# Patient Record
Sex: Female | Born: 1985 | Race: Black or African American | Hispanic: No | Marital: Single | Smoking: Current every day smoker
Health system: Southern US, Community
[De-identification: ages and names within clinical notes are randomized; demographics above are authoritative.]

## PROBLEM LIST (undated history)

## (undated) DIAGNOSIS — I10 Essential (primary) hypertension: Secondary | ICD-10-CM

## (undated) DIAGNOSIS — O039 Complete or unspecified spontaneous abortion without complication: Secondary | ICD-10-CM

## (undated) HISTORY — PX: TUBAL LIGATION: SHX77

---

## 2009-12-15 ENCOUNTER — Emergency Department (HOSPITAL_BASED_OUTPATIENT_CLINIC_OR_DEPARTMENT_OTHER): Admission: EM | Admit: 2009-12-15 | Discharge: 2009-12-15 | Payer: Self-pay | Admitting: Emergency Medicine

## 2010-12-30 LAB — URINALYSIS, ROUTINE W REFLEX MICROSCOPIC
Glucose, UA: NEGATIVE mg/dL
Ketones, ur: 15 mg/dL — AB
Protein, ur: 100 mg/dL — AB
pH: 6 (ref 5.0–8.0)

## 2010-12-30 LAB — URINE MICROSCOPIC-ADD ON

## 2010-12-30 LAB — PREGNANCY, URINE: Preg Test, Ur: NEGATIVE

## 2013-04-05 HISTORY — PX: DILATION AND CURETTAGE OF UTERUS: SHX78

## 2014-03-14 ENCOUNTER — Emergency Department (HOSPITAL_BASED_OUTPATIENT_CLINIC_OR_DEPARTMENT_OTHER)
Admission: EM | Admit: 2014-03-14 | Discharge: 2014-03-14 | Disposition: A | Discharge status: Home / Self Care | Payer: BC Managed Care – PPO | Attending: Emergency Medicine | Admitting: Emergency Medicine

## 2014-03-14 ENCOUNTER — Encounter (HOSPITAL_BASED_OUTPATIENT_CLINIC_OR_DEPARTMENT_OTHER): Payer: Self-pay | Admitting: Emergency Medicine

## 2014-03-14 DIAGNOSIS — A499 Bacterial infection, unspecified: Secondary | ICD-10-CM | POA: Insufficient documentation

## 2014-03-14 DIAGNOSIS — B9689 Other specified bacterial agents as the cause of diseases classified elsewhere: Secondary | ICD-10-CM | POA: Insufficient documentation

## 2014-03-14 DIAGNOSIS — F17200 Nicotine dependence, unspecified, uncomplicated: Secondary | ICD-10-CM | POA: Insufficient documentation

## 2014-03-14 DIAGNOSIS — N760 Acute vaginitis: Secondary | ICD-10-CM | POA: Insufficient documentation

## 2014-03-14 DIAGNOSIS — Z3202 Encounter for pregnancy test, result negative: Secondary | ICD-10-CM | POA: Insufficient documentation

## 2014-03-14 HISTORY — DX: Complete or unspecified spontaneous abortion without complication: O03.9

## 2014-03-14 LAB — WET PREP, GENITAL
TRICH WET PREP: NONE SEEN
YEAST WET PREP: NONE SEEN

## 2014-03-14 LAB — URINALYSIS, ROUTINE W REFLEX MICROSCOPIC
BILIRUBIN URINE: NEGATIVE
GLUCOSE, UA: NEGATIVE mg/dL
HGB URINE DIPSTICK: NEGATIVE
Ketones, ur: NEGATIVE mg/dL
Leukocytes, UA: NEGATIVE
Nitrite: NEGATIVE
PH: 5.5 (ref 5.0–8.0)
Protein, ur: NEGATIVE mg/dL
SPECIFIC GRAVITY, URINE: 1.013 (ref 1.005–1.030)
Urobilinogen, UA: 0.2 mg/dL (ref 0.0–1.0)

## 2014-03-14 LAB — PREGNANCY, URINE: PREG TEST UR: NEGATIVE

## 2014-03-14 MED ORDER — METRONIDAZOLE 500 MG PO TABS: 500.0000 mg | ORAL_TABLET | Freq: Two times a day (BID) | ORAL | Status: DC

## 2014-03-14 NOTE — ED Notes (Signed)
Patient states she developed a sudden lower abdominal pain this morning at 0900.  States the pain as intermittent sharp pain, associated with nausea.

## 2014-03-14 NOTE — ED Provider Notes (Signed)
CSN: [id number]     Arrival date & time 03/14/14  1252 History   First MD Initiated Contact with Patient 03/14/14 1310     Chief Complaint  Patient presents with  . Abdominal Pain     (Consider location/radiation/quality/duration/timing/severity/associated sxs/prior Treatment) HPI Comments: Pt states that she had acute onset of sharp lower abdominal pain that started this morning. Has had some nausea associated with it. Denies fever, vomiting or diarrhea. Pain has almost resolved. Was seen at [facility] last week because of urinary frequency and urgency and was told that she didn't have a uti. Denies vaginal discharge. Still having some urinary symptoms, states that she is having some problems with constipation  The history is provided by the patient. No language interpreter was used.    Past Medical History  Diagnosis Date  . Miscarriage 7/15   Past Surgical History  Procedure Laterality Date  . Dilation and curettage of uterus  7/14   No family history on file. History  Substance Use Topics  . Smoking status: Not on file  . Smokeless tobacco: Not on file  . Alcohol Use: Not on file   OB History   Grav Para Term Preterm Abortions TAB SAB Ect Mult Living                 Review of Systems  Constitutional: Negative.   Respiratory: Negative.   Cardiovascular: Negative.       Allergies  Review of patient's allergies indicates not on file.  Home Medications   Prior to Admission medications   Not on File   BP 115/81  Pulse 88  Temp(Src) 98.3 F (36.8 C) (Oral)  Resp 20  SpO2 99%  LMP 03/06/2014 Physical Exam  Nursing note and vitals reviewed. Constitutional: She is oriented to person, place, and time. She appears well-developed and well-nourished.  Cardiovascular: Normal rate and regular rhythm.   Pulmonary/Chest: Effort normal and breath sounds normal.  Abdominal: Soft. Bowel sounds are normal. There is no tenderness.  Genitourinary:  White discharge. No  cmt. No adnexal tenderness  Musculoskeletal: Normal range of motion.  Neurological: She is alert and oriented to person, place, and time.  Skin: Skin is warm and dry.  Psychiatric: She has a normal mood and affect.    ED Course  Procedures (including critical care time) Labs Review Labs Reviewed  WET PREP, GENITAL - Abnormal; Notable for the following:    Clue Cells Wet Prep HPF POC FEW (*)    WBC, Wet Prep HPF POC FEW (*)    All other components within normal limits  GC/CHLAMYDIA PROBE AMP  URINALYSIS, ROUTINE W REFLEX MICROSCOPIC  PREGNANCY, URINE    Imaging Review No results found.   EKG Interpretation None      MDM   Final diagnoses:  BV (bacterial vaginosis)    Abdomen in benign. Pt is pain free at this time. Will treat for bv. Std cultures sent    Teressa Lower, NP 03/14/14 1411

## 2014-03-14 NOTE — ED Provider Notes (Signed)
Medical screening examination/treatment/procedure(s) were performed by non-physician practitioner and as supervising physician I was immediately available for consultation/collaboration.   EKG Interpretation None        Rolan Bucco, MD 03/14/14 1504

## 2014-03-14 NOTE — Discharge Instructions (Signed)
Bacterial Vaginosis Bacterial vaginosis is an infection of the vagina. It happens when too many of certain germs (bacteria) grow in the vagina. HOME CARE  Take your medicine as told by your doctor.  Finish your medicine even if you start to feel better.  Do not have sex until you finish your medicine and are better.  Tell your sex partner that you have an infection. They should see their doctor for treatment.  Practice safe sex. Use condoms. Have only one sex partner. GET HELP IF:  You are not getting better after 3 days of treatment.  You have more grey fluid (discharge) coming from your vagina than before.  You have more pain than before.  You have a fever. MAKE SURE YOU:   Understand these instructions.  Will watch your condition.  Will get help right away if you are not doing well or get worse. Document Released: 07/01/2008 Document Revised: 07/13/2013 Document Reviewed: 05/04/2013 [employer] Patient Information 2014 [employer].  

## 2014-03-15 LAB — GC/CHLAMYDIA PROBE AMP
CT PROBE, AMP APTIMA: NEGATIVE
GC Probe RNA: NEGATIVE

## 2014-04-05 DIAGNOSIS — O039 Complete or unspecified spontaneous abortion without complication: Secondary | ICD-10-CM

## 2014-04-05 HISTORY — DX: Complete or unspecified spontaneous abortion without complication: O03.9

## 2015-05-30 ENCOUNTER — Emergency Department (HOSPITAL_BASED_OUTPATIENT_CLINIC_OR_DEPARTMENT_OTHER): Payer: BLUE CROSS/BLUE SHIELD

## 2015-05-30 ENCOUNTER — Encounter (HOSPITAL_BASED_OUTPATIENT_CLINIC_OR_DEPARTMENT_OTHER): Payer: Self-pay | Admitting: Emergency Medicine

## 2015-05-30 ENCOUNTER — Emergency Department (HOSPITAL_BASED_OUTPATIENT_CLINIC_OR_DEPARTMENT_OTHER)
Admission: EM | Admit: 2015-05-30 | Discharge: 2015-05-30 | Disposition: A | Discharge status: Home / Self Care | Payer: BLUE CROSS/BLUE SHIELD | Attending: Emergency Medicine | Admitting: Emergency Medicine

## 2015-05-30 DIAGNOSIS — Z880 Allergy status to penicillin: Secondary | ICD-10-CM | POA: Diagnosis not present

## 2015-05-30 DIAGNOSIS — Z720 Tobacco use: Secondary | ICD-10-CM | POA: Diagnosis not present

## 2015-05-30 DIAGNOSIS — Y998 Other external cause status: Secondary | ICD-10-CM | POA: Insufficient documentation

## 2015-05-30 DIAGNOSIS — Y9389 Activity, other specified: Secondary | ICD-10-CM | POA: Diagnosis not present

## 2015-05-30 DIAGNOSIS — M25561 Pain in right knee: Secondary | ICD-10-CM

## 2015-05-30 DIAGNOSIS — Y92410 Unspecified street and highway as the place of occurrence of the external cause: Secondary | ICD-10-CM | POA: Diagnosis not present

## 2015-05-30 DIAGNOSIS — S8991XA Unspecified injury of right lower leg, initial encounter: Secondary | ICD-10-CM | POA: Insufficient documentation

## 2015-05-30 MED ORDER — IBUPROFEN 600 MG PO TABS: 600.0000 mg | ORAL_TABLET | Freq: Four times a day (QID) | ORAL | Status: DC | PRN

## 2015-05-30 MED ORDER — IBUPROFEN 800 MG PO TABS
800.0000 mg | ORAL_TABLET | Freq: Once | ORAL | Status: AC
  Administered 2015-05-30: 800 mg via ORAL
  Filled 2015-05-30: qty 1

## 2015-05-30 NOTE — Discharge Instructions (Signed)
You were evaluated in the ED for your right knee pain. Her x-rays are negative for any acute token bones or dislocations. You were given a knee immobilizer, he may wear this while walking, but take it off when at rest. He may take Motrin as needed for discomfort. Please follow-up with your doctors in one week for reevaluation. Return to ED for worsening symptoms like fevers, chills, redness or swelling in her knee, decreased range of motion.  Arthralgia Your caregiver has diagnosed you as suffering from an arthralgia. Arthralgia means there is pain in a joint. This can come from many reasons including:  Bruising the joint which causes soreness (inflammation) in the joint.  Wear and tear on the joints which occur as we grow older (osteoarthritis).  Overusing the joint.  Various forms of arthritis.  Infections of the joint. Regardless of the cause of pain in your joint, most of these different pains respond to anti-inflammatory drugs and rest. The exception to this is when a joint is infected, and these cases are treated with antibiotics, if it is a bacterial infection. HOME CARE INSTRUCTIONS   Rest the injured area for as long as directed by your caregiver. Then slowly start using the joint as directed by your caregiver and as the pain allows. Crutches as directed may be useful if the ankles, knees or hips are involved. If the knee was splinted or casted, continue use and care as directed. If an stretchy or elastic wrapping bandage has been applied today, it should be removed and re-applied every 3 to 4 hours. It should not be applied tightly, but firmly enough to keep swelling down. Watch toes and feet for swelling, bluish discoloration, coldness, numbness or excessive pain. If any of these problems (symptoms) occur, remove the ace bandage and re-apply more loosely. If these symptoms persist, contact your caregiver or return to this location.  For the first 24 hours, keep the injured extremity  elevated on pillows while lying down.  Apply ice for 15-20 minutes to the sore joint every couple hours while awake for the first half day. Then 03-04 times per day for the first 48 hours. Put the ice in a plastic bag and place a towel between the bag of ice and your skin.  Wear any splinting, casting, elastic bandage applications, or slings as instructed.  Only take over-the-counter or prescription medicines for pain, discomfort, or fever as directed by your caregiver. Do not use aspirin immediately after the injury unless instructed by your physician. Aspirin can cause increased bleeding and bruising of the tissues.  If you were given crutches, continue to use them as instructed and do not resume weight bearing on the sore joint until instructed. Persistent pain and inability to use the sore joint as directed for more than 2 to 3 days are warning signs indicating that you should see a caregiver for a follow-up visit as soon as possible. Initially, a hairline fracture (break in bone) may not be evident on X-rays. Persistent pain and swelling indicate that further evaluation, non-weight bearing or use of the joint (use of crutches or slings as instructed), or further X-rays are indicated. X-rays may sometimes not show a small fracture until a week or 10 days later. Make a follow-up appointment with your own caregiver or one to whom we have referred you. A radiologist (specialist in reading X-rays) may read your X-rays. Make sure you know how you are to obtain your X-ray results. Do not assume everything is normal if  you do not hear from [place]. SEEK MEDICAL CARE IF: Bruising, swelling, or pain increases. SEEK IMMEDIATE MEDICAL CARE IF:   Your fingers or toes are numb or blue.  The pain is not responding to medications and continues to stay the same or get worse.  The pain in your joint becomes severe.  You develop a fever over 102 F (38.9 C).  It becomes impossible to move or use the joint. MAKE  SURE YOU:   Understand these instructions.  Will watch your condition.  Will get help right away if you are not doing well or get worse. Document Released: 09/22/2005 Document Revised: 12/15/2011 Document Reviewed: 05/10/2008 [facility] Patient Information 2015 [place], [place]. This information is not intended to replace advice given to you by your health care provider. Make sure you discuss any questions you have with your health care provider.

## 2015-05-30 NOTE — ED Notes (Signed)
PA at bedside.
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
## 2015-11-12 ENCOUNTER — Other Ambulatory Visit (HOSPITAL_COMMUNITY): Payer: Self-pay | Admitting: Specialist

## 2015-11-12 DIAGNOSIS — Z3A16 16 weeks gestation of pregnancy: Secondary | ICD-10-CM

## 2015-11-12 DIAGNOSIS — Q913 Trisomy 18, unspecified: Secondary | ICD-10-CM

## 2015-11-15 ENCOUNTER — Encounter (HOSPITAL_COMMUNITY): Payer: Self-pay | Admitting: Specialist

## 2015-11-15 ENCOUNTER — Other Ambulatory Visit: Payer: Self-pay | Admitting: Specialist

## 2015-11-22 ENCOUNTER — Encounter (HOSPITAL_COMMUNITY): Payer: Self-pay

## 2015-11-22 ENCOUNTER — Ambulatory Visit (HOSPITAL_COMMUNITY)
Admission: RE | Admit: 2015-11-22 | Discharge: 2015-11-22 | Disposition: A | Discharge status: Home / Self Care | Payer: BLUE CROSS/BLUE SHIELD | Source: Ambulatory Visit | Attending: Specialist | Admitting: Specialist

## 2015-11-22 ENCOUNTER — Other Ambulatory Visit (HOSPITAL_COMMUNITY): Payer: Self-pay | Admitting: Specialist

## 2015-11-22 VITALS — BP 116/69 | HR 83 | Wt 186.2 lb

## 2015-11-22 DIAGNOSIS — Z3689 Encounter for other specified antenatal screening: Secondary | ICD-10-CM

## 2015-11-22 DIAGNOSIS — O34219 Maternal care for unspecified type scar from previous cesarean delivery: Secondary | ICD-10-CM | POA: Insufficient documentation

## 2015-11-22 DIAGNOSIS — O280 Abnormal hematological finding on antenatal screening of mother: Secondary | ICD-10-CM

## 2015-11-22 DIAGNOSIS — Z315 Encounter for genetic counseling: Secondary | ICD-10-CM | POA: Insufficient documentation

## 2015-11-22 DIAGNOSIS — Z3A18 18 weeks gestation of pregnancy: Secondary | ICD-10-CM | POA: Insufficient documentation

## 2015-11-22 DIAGNOSIS — O99332 Smoking (tobacco) complicating pregnancy, second trimester: Secondary | ICD-10-CM

## 2015-11-22 DIAGNOSIS — O283 Abnormal ultrasonic finding on antenatal screening of mother: Secondary | ICD-10-CM | POA: Diagnosis not present

## 2015-11-22 DIAGNOSIS — Z3A16 16 weeks gestation of pregnancy: Secondary | ICD-10-CM

## 2015-11-22 DIAGNOSIS — Q913 Trisomy 18, unspecified: Secondary | ICD-10-CM

## 2015-11-22 NOTE — Progress Notes (Signed)
Genetic Counseling  High-Risk Gestation Note  Appointment Date:  11/22/2015 Referred By: Alm Bustard, MD Date of Birth:  [birth year]   Pregnancy History: [id number] Estimated Date of Delivery: 04/24/16 Estimated Gestational Age: [redacted]w[redacted]d Attending: Alpha Gula, MD   [name] was seen for genetic counseling because of an increased risk for fetal trisomy 18 based on Quad screen through [facility].  In Summary:   1 in 28 Trisomy 18 risk from Quad screen  Noninvasive prenatal screening ([facility]) subsequently performed through OB and within normal range  Detailed ultrasound performed today; complete ultrasound results reported separately  Echogenic intracardiac focus visualized; Given low risk NIPS, this finding would not significantly adjust the risk for fetal Down syndrome  Patient declined amniocentesis  Family history noncontributory  Follow-up ultrasound scheduled to assess fetal growth given low analyte on Quad screen  She was counseled regarding the Quad screen result and the associated 1 in 36 risk for fetal trisomy 18.  We reviewed chromosomes, nondisjunction, and the common features poor prognosis of trisomy 78.  In addition, we reviewed the screen adjusted reduction in risks for Down syndrome (1 in 16,800) and ONTDs.  We also discussed other explanations for a screen positive result including: a gestational dating error, differences in maternal metabolism, and normal variation. They understand that this screening is not diagnostic for Trisomy 18 but provides a risk assessment.  [name] subsequently had noninvasive prenatal screening (NIPS)/cell free DNA testing (cfDNA) facilitated through her OB office. This screen through [facility] was within normal range for chromosomes 21, 18, 13, and sex chromosomes. We reviewed the methodology of NIPS, the conditions for which it assesses and the detection rates and false positive rates. We discussed that while highly specific  and sensitive, it is not diagnostic for these conditions.   Detailed ultrasound was performed today. Visualized fetal anatomy was within normal limits. Echogenic intracardiac focus was visualized. Complete ultrasound results reported separately. An isolated echogenic focus is generally believed to be a normal variation without any concerns for the pregnancy.  Isolated echogenic cardiac foci are not associated with congenital heart defects in the baby or compromised cardiac function after birth.  However, an echogenic cardiac focus is associated with a slightly increased chance for Down syndrome in the pregnancy. Given the patient's previous Quad screen, which decreased the risk for fetal Down syndrome and the patient's previous low risk NIPS, the finding of EIF would not be associated with a significant increased risk for fetal Down syndrome.   She was also counseled regarding diagnostic testing via amniocentesis. We reviewed the approximate 1 in 300-500 risk for complications for amniocentesis, including spontaneous pregnancy loss. After careful consideration, she declined amniocentesis. A follow-up ultrasound was scheduled for 01/03/16, given the low uE3 analyte (0.4 MoM) on the patient's Quad screen. She understands that screening tests cannot rule out all birth defects or genetic syndromes. The patient was advised of this limitation and states she still does not want additional testing at this time.   [name] was provided with written information regarding sickle cell anemia (SCA) including the carrier frequency and incidence in the African-American population, the availability of carrier testing and prenatal diagnosis if indicated.  In addition, we discussed that hemoglobinopathies are routinely screened for as part of the [place] newborn screening panel.  She was unsure if this screening had been performed previously for her. She declined hemoglobin electrophoresis today.   Both family histories  were reviewed and found to be noncontributory for  birth defects, intellectual disability, recurrent pregnancy loss, and known genetic conditions. [name] was not familiar with the father of the baby's extended family history.  We, therefore, cannot comment on how his history might contribute to the overall chance for the baby to have a birth defect.  Without further information regarding the provided family history, an accurate genetic risk cannot be calculated. Further genetic counseling is warranted if more information is obtained.  [name] denied exposure to environmental toxins or chemical agents. She denied the use of alcohol, tobacco or street drugs. She denied significant viral illnesses during the course of her pregnancy. Her medical and surgical histories were noncontributory.   I counseled [name] for approximately 30 minutes regarding the above risks and available options.   Quinn Plowman, MS,  Certified Genetic Counselor 11/22/2015

## 2015-11-28 ENCOUNTER — Other Ambulatory Visit (HOSPITAL_COMMUNITY): Payer: Self-pay

## 2016-01-03 ENCOUNTER — Encounter (HOSPITAL_COMMUNITY): Payer: Self-pay

## 2016-01-03 ENCOUNTER — Ambulatory Visit (HOSPITAL_COMMUNITY)
Admission: RE | Admit: 2016-01-03 | Discharge: 2016-01-03 | Disposition: A | Discharge status: Home / Self Care | Payer: BLUE CROSS/BLUE SHIELD | Source: Ambulatory Visit | Attending: Specialist | Admitting: Specialist

## 2016-01-03 ENCOUNTER — Other Ambulatory Visit (HOSPITAL_COMMUNITY): Payer: Self-pay | Admitting: Maternal and Fetal Medicine

## 2016-01-03 DIAGNOSIS — O283 Abnormal ultrasonic finding on antenatal screening of mother: Secondary | ICD-10-CM | POA: Diagnosis not present

## 2016-01-03 DIAGNOSIS — O358XX0 Maternal care for other (suspected) fetal abnormality and damage, not applicable or unspecified: Secondary | ICD-10-CM | POA: Insufficient documentation

## 2016-01-03 DIAGNOSIS — O34219 Maternal care for unspecified type scar from previous cesarean delivery: Secondary | ICD-10-CM

## 2016-01-03 DIAGNOSIS — Z3A24 24 weeks gestation of pregnancy: Secondary | ICD-10-CM | POA: Diagnosis not present

## 2016-01-03 DIAGNOSIS — O99332 Smoking (tobacco) complicating pregnancy, second trimester: Secondary | ICD-10-CM | POA: Diagnosis not present

## 2016-01-03 DIAGNOSIS — IMO0001 Reserved for inherently not codable concepts without codable children: Secondary | ICD-10-CM

## 2016-01-03 DIAGNOSIS — O289 Unspecified abnormal findings on antenatal screening of mother: Secondary | ICD-10-CM

## 2016-01-03 DIAGNOSIS — O280 Abnormal hematological finding on antenatal screening of mother: Secondary | ICD-10-CM

## 2016-01-04 ENCOUNTER — Other Ambulatory Visit (HOSPITAL_COMMUNITY): Payer: Self-pay | Admitting: *Deleted

## 2016-01-04 DIAGNOSIS — O281 Abnormal biochemical finding on antenatal screening of mother: Secondary | ICD-10-CM

## 2016-02-14 ENCOUNTER — Encounter (HOSPITAL_COMMUNITY): Payer: Self-pay

## 2016-02-14 ENCOUNTER — Ambulatory Visit (HOSPITAL_COMMUNITY)
Admission: RE | Admit: 2016-02-14 | Discharge: 2016-02-14 | Disposition: A | Discharge status: Home / Self Care | Payer: BLUE CROSS/BLUE SHIELD | Source: Ambulatory Visit | Attending: Specialist | Admitting: Specialist

## 2016-02-14 ENCOUNTER — Other Ambulatory Visit (HOSPITAL_COMMUNITY): Payer: Self-pay | Admitting: Maternal and Fetal Medicine

## 2016-02-14 VITALS — BP 114/74 | HR 75 | Wt 203.5 lb

## 2016-02-14 DIAGNOSIS — Z3A30 30 weeks gestation of pregnancy: Secondary | ICD-10-CM

## 2016-02-14 DIAGNOSIS — O99333 Smoking (tobacco) complicating pregnancy, third trimester: Secondary | ICD-10-CM

## 2016-02-14 DIAGNOSIS — O281 Abnormal biochemical finding on antenatal screening of mother: Secondary | ICD-10-CM | POA: Insufficient documentation

## 2016-02-14 DIAGNOSIS — O280 Abnormal hematological finding on antenatal screening of mother: Secondary | ICD-10-CM

## 2016-02-14 DIAGNOSIS — O289 Unspecified abnormal findings on antenatal screening of mother: Secondary | ICD-10-CM

## 2016-07-31 IMAGING — US US MFM OB FOLLOW-UP
1 series · 14 of 28 positions shown · non-contrast
Comparison: none

[Series 1: us mfm ob follow-up · 38 acquisitions, 14 frames shown]
[im 2/38]
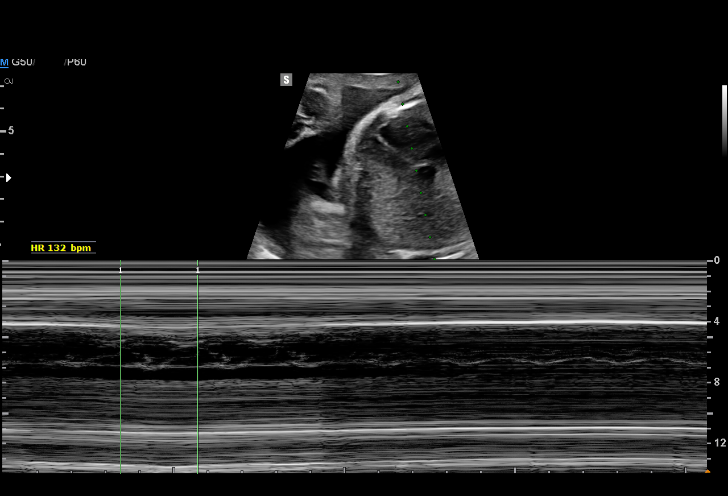
[im 5/38]
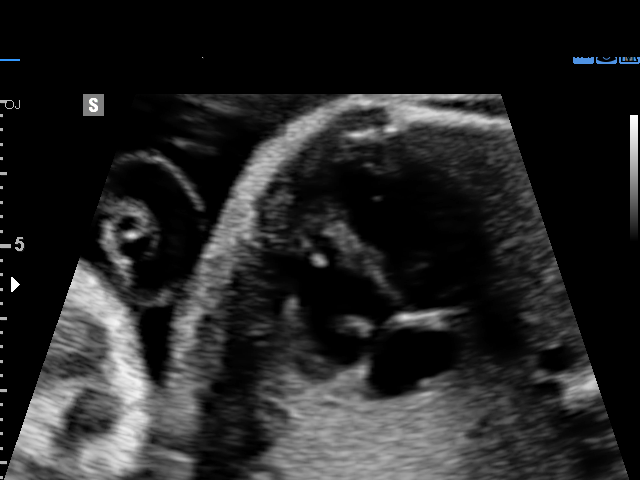
[im 7/38]
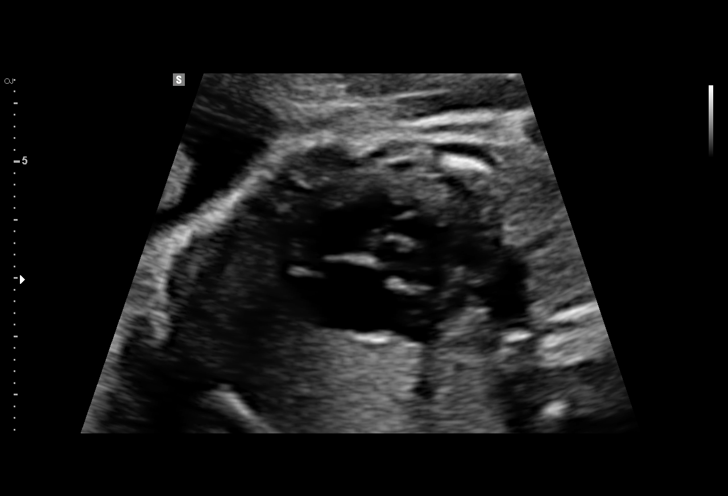
[im 10/38]
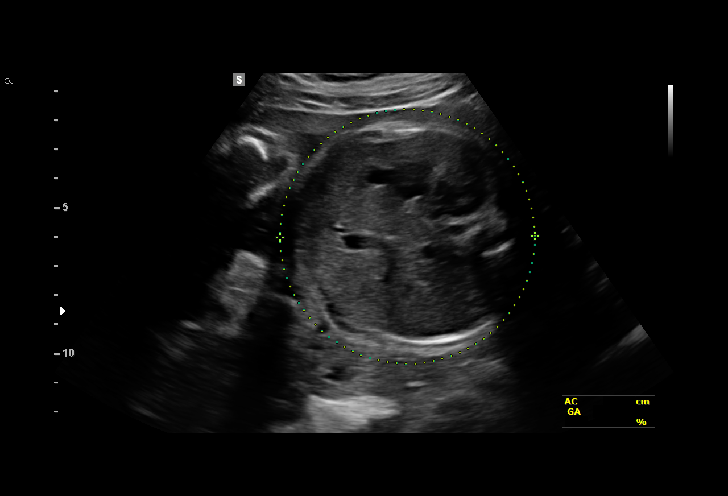
[im 13/38]
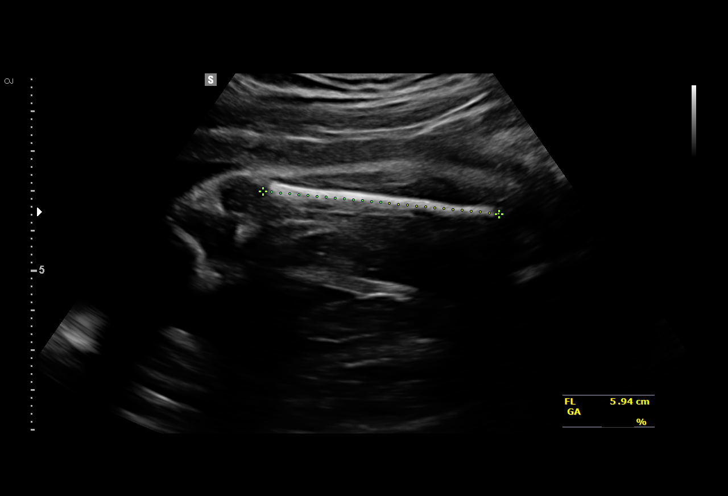
[im 16/38]
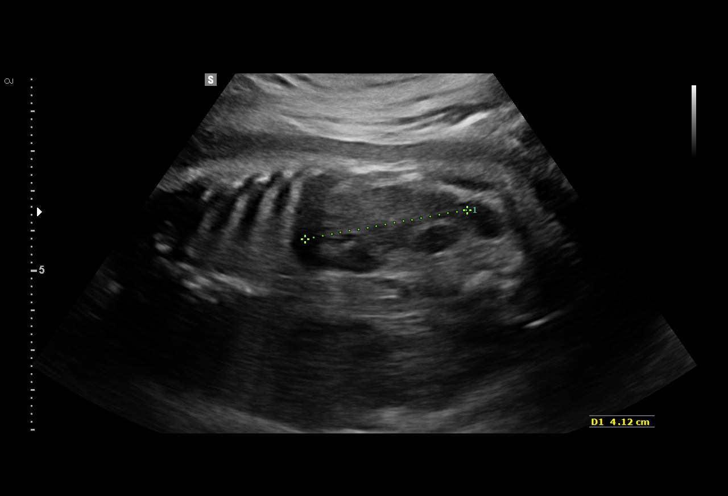
[im 18/38]
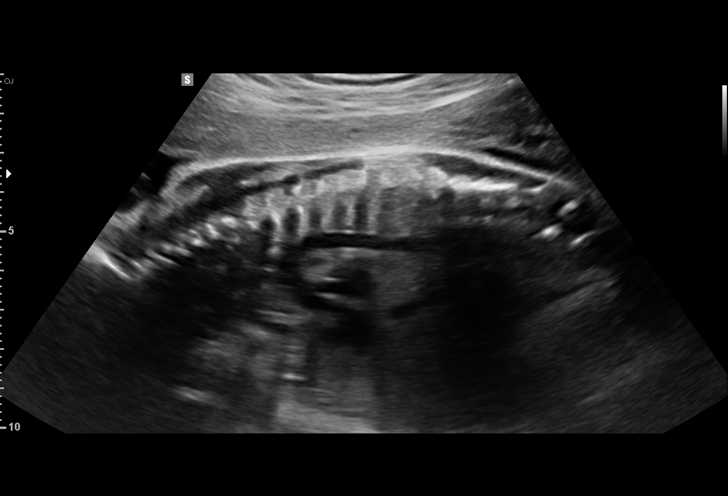
[im 21/38]
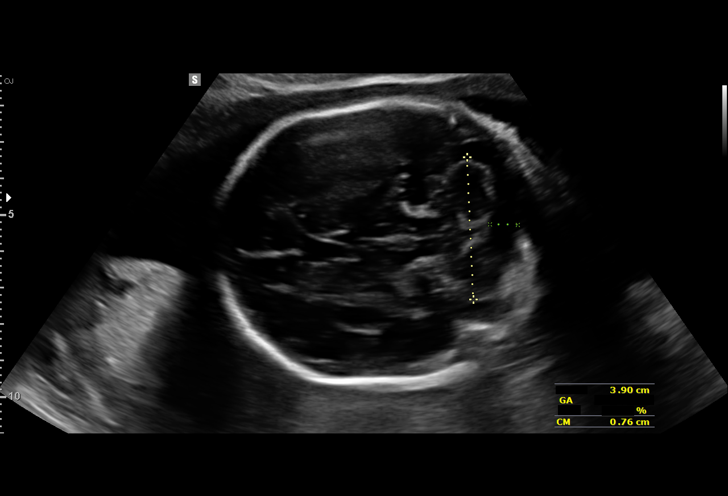
[im 24/38]
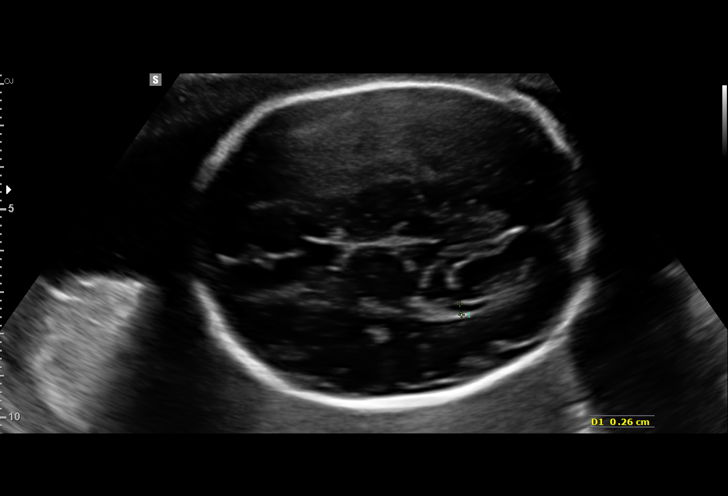
[im 27/38]
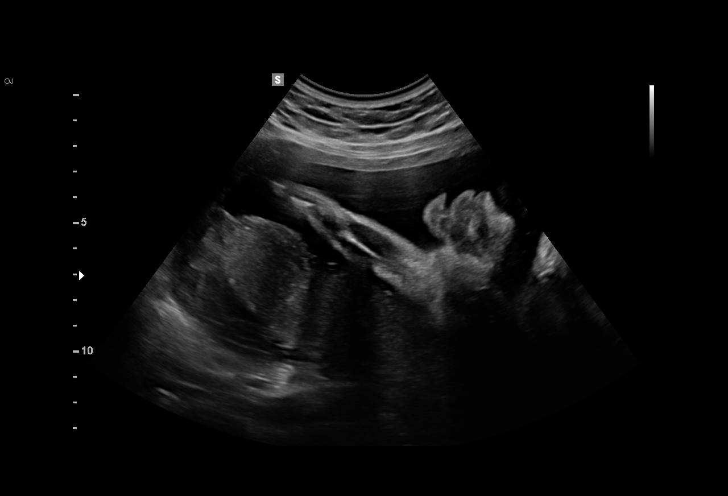
[im 29/38]
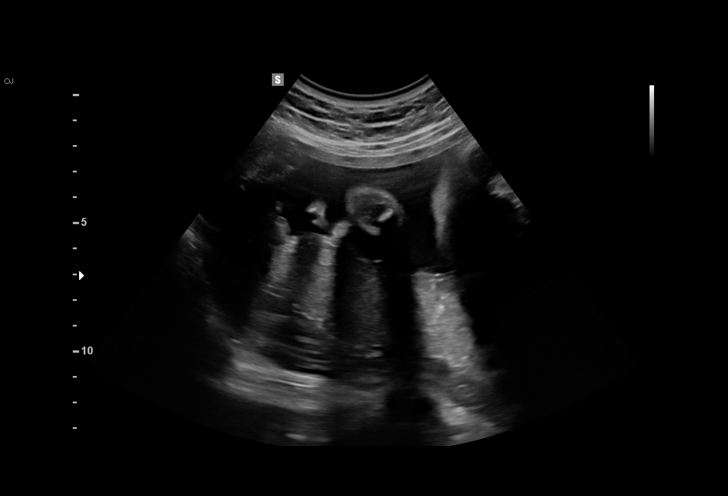
[im 32/38]
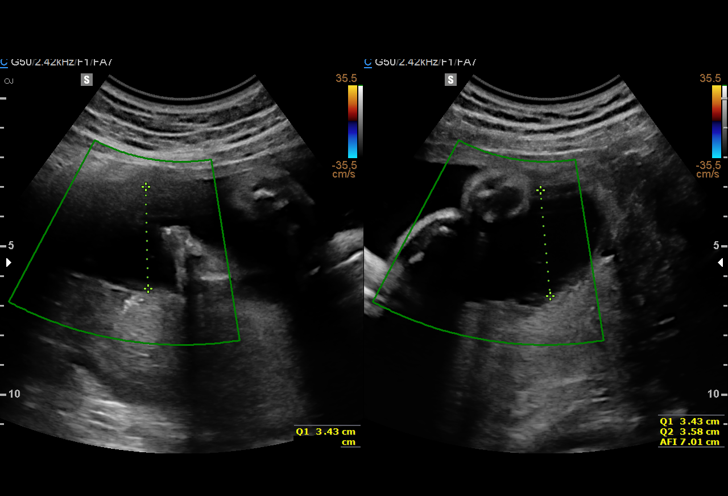
[im 35/38]
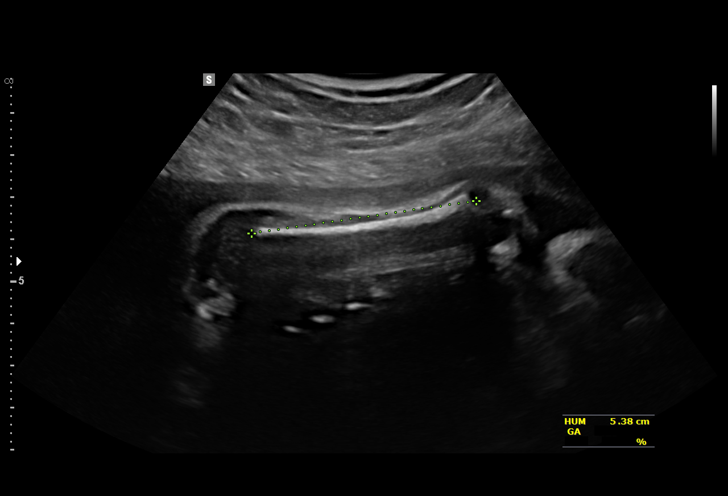
[im 38/38]
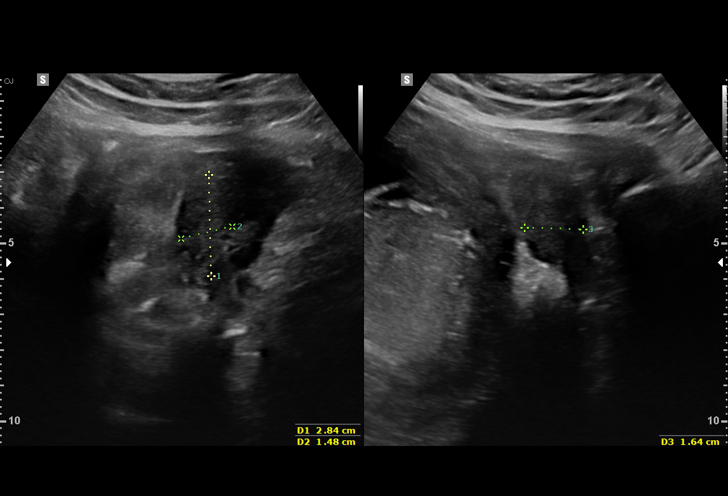

[14 of 28 positions shown; findings below may reference images not displayed]

[HOSPITAL][HOSPITAL]

1  LORRAINE JIM            [id number]       [id number]     [id number]
Indications

30 weeks gestation of pregnancy
Abnormal biochemical screen (quad) for
Trisomy 18 ([DATE]) - low risk NIPS
Abnormal finding on antenatal screening
(low uE3 0.4 MoM)
Tobacco use complicating pregnancy, third
trimester
OB History

Gravidity:    3         Term:   1        Prem:   0         SAB:   0
TOP:          1       Ectopic:  0        Living: 1
Fetal Evaluation

Num Of Fetuses:     1
Fetal Heart         132
Rate(bpm):
Cardiac Activity:   Observed
Presentation:       Breech
Placenta:           Posterior, above cervical os

Amniotic Fluid
AFI FV:      Subjectively within normal limits

AFI Sum(cm)     %Tile       Largest Pocket(cm)
13.28           40

RUQ(cm)       RLQ(cm)       LUQ(cm)        LLQ(cm)
3.43
Biometry
BPD:      76.8  mm     G. Age:  30w 6d         64  %    CI:         73.38  %    70 - 86
FL/HC:       20.9  %    19.2 -
HC:      284.9  mm     G. Age:  31w 2d         52  %    HC/AC:       1.04       0.99 -
AC:      275.1  mm     G. Age:  31w 4d         86  %    FL/BPD:      77.6  %    71 - 87
FL:       59.6  mm     G. Age:  31w 0d         65  %    FL/AC:       21.7  %    20 - 24
HUM:      53.9  mm     G. Age:  31w 2d         76  %
CER:        39  mm     G. Age:  33w 0d         95  %

CM:        7.6  mm

Est. FW:    0366   gm    3 lb 13 oz     76  %
Gestational Age

LMP:           30w 0d        Date:  07/19/15                 EDD:    04/24/16
U/S Today:     31w 1d                                        EDD:    04/16/16
Best:          30w 0d     Det. By:  LMP  (07/19/15)          EDD:    04/24/16
Anatomy

Cranium:               Appears normal         Aortic Arch:            Appears normal
Cavum:                 Appears normal         Ductal Arch:            Previously seen
Ventricles:            Appears normal         Diaphragm:              Appears normal
Choroid Plexus:        Previously seen        Stomach:                Appears normal, left
sided
Cerebellum:            Appears normal         Abdomen:                Appears normal
Posterior Fossa:       Appears normal         Abdominal Wall:         Previously seen
Nuchal Fold:           Previously seen        Cord Vessels:           Appears normal (3
vessel cord)
Face:                  Orbits and profile     Kidneys:                Appear normal
previously seen
Lips:                  Appears normal         Bladder:                Appears normal
Thoracic:              Appears normal         Spine:                  Previously seen
Heart:                 Appears normal         Upper Extremities:      Previously seen
(4CH, axis, and
situs)
RVOT:                  Previously seen        Lower Extremities:      Previously seen
LVOT:                  Previously seen

Other:  Fetus appears to be a male. Heels and 5th digit previously visualized.
Nasal bone previously visualized. Technically difficult due to fetal
position.
Cervix Uterus Adnexa

Cervix
Not visualized (advanced GA >22wks)

Left Ovary
Within normal limits.

Right Ovary
Within normal limits.
Impression

SIUP at 30+0 weeks
Normal interval anatomy; anatomic survey complete
Normal amniotic fluid volume
Appropriate interval growth with EFW at the 76th %tile
Recommendations

Follow-up as clinically indicated

## 2016-09-26 ENCOUNTER — Encounter (HOSPITAL_COMMUNITY): Payer: Self-pay

## 2016-10-06 ENCOUNTER — Emergency Department (HOSPITAL_BASED_OUTPATIENT_CLINIC_OR_DEPARTMENT_OTHER)
Admission: EM | Admit: 2016-10-06 | Discharge: 2016-10-06 | Disposition: A | Discharge status: Home / Self Care | Payer: BLUE CROSS/BLUE SHIELD | Attending: Emergency Medicine | Admitting: Emergency Medicine

## 2016-10-06 ENCOUNTER — Encounter (HOSPITAL_BASED_OUTPATIENT_CLINIC_OR_DEPARTMENT_OTHER): Payer: Self-pay | Admitting: *Deleted

## 2016-10-06 DIAGNOSIS — T783XXA Angioneurotic edema, initial encounter: Secondary | ICD-10-CM | POA: Insufficient documentation

## 2016-10-06 DIAGNOSIS — F17210 Nicotine dependence, cigarettes, uncomplicated: Secondary | ICD-10-CM | POA: Diagnosis not present

## 2016-10-06 DIAGNOSIS — T464X5A Adverse effect of angiotensin-converting-enzyme inhibitors, initial encounter: Secondary | ICD-10-CM

## 2016-10-06 DIAGNOSIS — I10 Essential (primary) hypertension: Secondary | ICD-10-CM | POA: Insufficient documentation

## 2016-10-06 DIAGNOSIS — R220 Localized swelling, mass and lump, head: Secondary | ICD-10-CM | POA: Diagnosis present

## 2016-10-06 HISTORY — DX: Essential (primary) hypertension: I10

## 2016-10-06 MED ORDER — PREDNISONE 10 MG PO TABS: 20.0000 mg | ORAL_TABLET | Freq: Every day | ORAL | 0 refills | Status: AC

## 2016-10-06 MED ORDER — FAMOTIDINE IN NACL 20-0.9 MG/50ML-% IV SOLN
20.0000 mg | Freq: Once | INTRAVENOUS | Status: AC
  Administered 2016-10-06: 20 mg via INTRAVENOUS
  Filled 2016-10-06: qty 50

## 2016-10-06 MED ORDER — FAMOTIDINE 20 MG PO TABS: 20.0000 mg | ORAL_TABLET | Freq: Two times a day (BID) | ORAL | 0 refills | Status: AC

## 2016-10-06 MED ORDER — DIPHENHYDRAMINE HCL 50 MG/ML IJ SOLN
50.0000 mg | Freq: Once | INTRAMUSCULAR | Status: AC
  Administered 2016-10-06: 50 mg via INTRAVENOUS
  Filled 2016-10-06: qty 1

## 2016-10-06 MED ORDER — DIPHENHYDRAMINE HCL 25 MG PO TABS: 25.0000 mg | ORAL_TABLET | Freq: Four times a day (QID) | ORAL | 0 refills | Status: AC | PRN

## 2016-10-06 MED ORDER — METHYLPREDNISOLONE SODIUM SUCC 125 MG IJ SOLR
125.0000 mg | Freq: Once | INTRAMUSCULAR | Status: AC
  Administered 2016-10-06: 125 mg via INTRAVENOUS
  Filled 2016-10-06: qty 2

## 2016-10-06 NOTE — ED Provider Notes (Signed)
[facility] Provider [facility]   CSN: [id number] Arrival date & time: 10/06/16  1427     History   Chief Complaint Chief Complaint  Patient presents with  . Oral Swelling    HPI Tina Rogers is a 31 y.o. female.  HPI 31 yo F with PMHx of HTN on lisinopril who p/w lower lip swelling. Pt was in usual state of health until this morning, when she awoke with lower lip swelling. She noticed a mild swollen and itching sensation in her lip and went to the mirror, where she noticed it was swollen. She denies any other swelling. Denies any tongue or upper lip swelling. She denies any voice changes or difficulty swallowing. Denies any shortness of breath. She has been on lisinopril for 5 months. No other recent medication or makeup changes. No other known allergy exposures. No rash.  Past Medical History:  Diagnosis Date  . Hypertension   . Miscarriage 7/15    Patient Active Problem List   Diagnosis Date Noted  . Abnormal maternal serum screening test 11/22/2015  . [redacted] weeks gestation of pregnancy     Past Surgical History:  Procedure Laterality Date  . DILATION AND CURETTAGE OF UTERUS  7/14  . TUBAL LIGATION      OB History    Gravida Para Term Preterm AB Living   3 1 1  0 1 1   SAB TAB Ectopic Multiple Live Births   0 1 0 0         Home Medications    Prior to Admission medications   Not on File    Family History No family history on file.  Social History Social History  Substance Use Topics  . Smoking status: Current Every Day Smoker    Types: Cigarettes  . Smokeless tobacco: Not on file  . Alcohol use No     Allergies   Penicillins and Lisinopril-hydrochlorothiazide   Review of Systems Review of Systems  Constitutional: Negative for chills and fever.  HENT: Positive for drooling and facial swelling. Negative for congestion, rhinorrhea and sore throat.   Eyes: Negative for visual disturbance.  Respiratory: Negative for cough, shortness of  breath and wheezing.   Cardiovascular: Negative for chest pain and leg swelling.  Gastrointestinal: Negative for abdominal pain, diarrhea, nausea and vomiting.  Genitourinary: Negative for dysuria, flank pain, vaginal bleeding and vaginal discharge.  Musculoskeletal: Negative for neck pain.  Skin: Negative for rash.  Allergic/Immunologic: Negative for immunocompromised state.  Neurological: Negative for syncope and headaches.  Hematological: Does not bruise/bleed easily.  All other systems reviewed and are negative.    Physical Exam Updated Vital Signs BP 106/76 (BP Location: Left Arm)   Pulse 67   Temp 98.2 F (36.8 C) (Oral)   Resp 16   Ht 5\' 4"  (1.626 m)   Wt 200 lb (90.7 kg)   LMP 09/18/2016   SpO2 100%   BMI 34.33 kg/m   Physical Exam  Constitutional: She is oriented to person, place, and time. She appears well-developed and well-nourished. No distress.  HENT:  Head: Normocephalic and atraumatic.  Marked edema of lower lip. Oropharynx widely patent. No tongue swelling. Normal tongue protrusion. No uvular or posterior pharyngeal edema is apparent. No stridor. Normal phonation.  Eyes: Conjunctivae are normal.  Neck: Neck supple.  Cardiovascular: Normal rate, regular rhythm and normal heart sounds.  Exam reveals no friction rub.   No murmur heard. Pulmonary/Chest: Effort normal and breath sounds normal. No respiratory distress. She has  no wheezes. She has no rales.  Abdominal: She exhibits no distension.  Musculoskeletal: She exhibits no edema.  Neurological: She is alert and oriented to person, place, and time. She exhibits normal muscle tone.  Skin: Skin is warm. Capillary refill takes less than 2 seconds.  Psychiatric: She has a normal mood and affect.  Nursing note and vitals reviewed.    ED Treatments / Results  Labs (all labs ordered are listed, but only abnormal results are displayed) Labs Reviewed - No data to display  EKG  EKG Interpretation None         Radiology No results found.  Procedures Procedures (including critical care time)  Medications Ordered in ED Medications  methylPREDNISolone sodium succinate (SOLU-MEDROL) 125 mg/2 mL injection 125 mg (125 mg Intravenous Given 10/06/16 1458)  diphenhydrAMINE (BENADRYL) injection 50 mg (50 mg Intravenous Given 10/06/16 1456)  famotidine (PEPCID) IVPB 20 mg premix (20 mg Intravenous New Bag/Given 10/06/16 1501)     Initial Impression / Assessment and Plan / ED Course  I have reviewed the triage vital signs and the nursing notes.  Pertinent labs & imaging results that were available during my care of the patient were reviewed by me and considered in my medical decision making (see chart for details).  Clinical Course     31 year old AAF here with minimally symptomatic lower lip swelling, likely 2/2 ACE-induced angioedema. No tongue, posterior pharyngeal, or neck involvement and airway is widely patent. Normal phonation and tolerating secretions. No other sx to suggest allergic reaction or anaphylaxis. No family h/o angioedema. Will give benadryl, steroids, pepcid and monitor x several hours in ED. If stable, will d/c with discontinuation of lisinopril and PCP f/u.  Final Clinical Impressions(s) / ED Diagnoses   Final diagnoses:  Angiotensin converting enzyme inhibitor-aggravated angioedema, initial encounter      Shaune Pollackameron Dartagnan Beavers, MD 10/06/16 1542

## 2016-10-06 NOTE — ED Provider Notes (Signed)
.   Please see previous physicians note regarding patient's presenting history and physical, initial ED course, and associated medical decision making. Presenting with Ace inhibitor induced angioedema starting this morning, and involving the lower lip. She had received steroids, antihistamines in the emergency department and observed. On my reevaluation, states that her swelling has been stable. No throat or tongue swelling. Does not necessary think swelling is worse but does not think it has gotten much better. Discussed observation in the hospital, but she states that she would rather be discharged home with supportive management. Her vital signs are stable, airway is patent. Discussed strict return instructions. She expressed understanding of all discharge instructions, and felt comfortable with the plan of care.    Tina Guiseana Duo Kynedi Profitt, MD 10/06/16 [phone]

## 2016-10-06 NOTE — Discharge Instructions (Addendum)
STOP TAKING YOUR LISINOPRIL, AND ANY OTHER MEDICATIONS THAT ARE CLASSIFIED AS AN ACE INHIBITOR  Call your doctor to discuss your ED visit and to discuss alternative medications. Make sure you note that you were in the ER with ACE-induced angioedema.

## 2016-10-06 NOTE — ED Notes (Signed)
Patient is calling mother to discuss option of admission.

## 2016-10-06 NOTE — ED Triage Notes (Addendum)
Pt states she woke up this am with lower lip swelling x 6 hrs , pt is on lisinopril denies tongue swelling , no resp distress noted

## 2021-02-28 ENCOUNTER — Other Ambulatory Visit: Payer: Self-pay

## 2021-02-28 ENCOUNTER — Emergency Department (HOSPITAL_BASED_OUTPATIENT_CLINIC_OR_DEPARTMENT_OTHER)
Admission: EM | Admit: 2021-02-28 | Discharge: 2021-02-28 | Disposition: A | Discharge status: Home / Self Care | Payer: BC Managed Care – PPO | Attending: Emergency Medicine | Admitting: Emergency Medicine

## 2021-02-28 ENCOUNTER — Encounter (HOSPITAL_BASED_OUTPATIENT_CLINIC_OR_DEPARTMENT_OTHER): Payer: Self-pay | Admitting: *Deleted

## 2021-02-28 DIAGNOSIS — R2231 Localized swelling, mass and lump, right upper limb: Secondary | ICD-10-CM | POA: Diagnosis present

## 2021-02-28 DIAGNOSIS — F17210 Nicotine dependence, cigarettes, uncomplicated: Secondary | ICD-10-CM | POA: Diagnosis not present

## 2021-02-28 DIAGNOSIS — L03011 Cellulitis of right finger: Secondary | ICD-10-CM

## 2021-02-28 DIAGNOSIS — I10 Essential (primary) hypertension: Secondary | ICD-10-CM | POA: Diagnosis not present

## 2021-02-28 MED ORDER — DOXYCYCLINE HYCLATE 100 MG PO CAPS: 100.0000 mg | ORAL_CAPSULE | Freq: Two times a day (BID) | ORAL | 0 refills | Status: AC

## 2021-02-28 MED ORDER — LIDOCAINE HCL (PF) 1 % IJ SOLN
5.0000 mL | Freq: Once | INTRAMUSCULAR | Status: AC
  Administered 2021-02-28: 5 mL
  Filled 2021-02-28: qty 5

## 2021-02-28 NOTE — Discharge Instructions (Signed)
1. Medications: doxycycline, usual home medications 2. Treatment: rest, drink plenty of fluids, warm water soaks, keep clean with warm soap and water 3. Follow Up: Please followup with your primary doctor in 2-3 days for discussion of your diagnoses and further evaluation after today's visit; if you do not have a primary care doctor use the resource guide provided to find one; Please return to the ER for worsening infection, fever, chills or other concerns

## 2021-02-28 NOTE — ED Triage Notes (Addendum)
Right ring finger paronychia.

## 2021-02-28 NOTE — ED Provider Notes (Signed)
[facility] EMERGENCY DEPARTMENT Provider Note   CSN: [id number] Arrival date & time: 02/28/21  1756     History Chief Complaint  Patient presents with  . Finger Swelling    Tina Rogers is a 35 y.o. female presents to the Emergency Department complaining of gradual, persistent, progressively worsening swelling of the right ring finer onset 3 days ago.  Pt reports she had acrylic nails and was biting them.  NO hx of same. No hx od diabetes or immunocompromise.  No treatments PTA.  Palpation makes the symptoms worse, nothing makes them better.   The history is provided by the patient and medical records. No language interpreter was used.       Past Medical History:  Diagnosis Date  . Hypertension   . Miscarriage 7/15    Patient Active Problem List   Diagnosis Date Noted  . Abnormal maternal serum screening test 11/22/2015  . [redacted] weeks gestation of pregnancy     Past Surgical History:  Procedure Laterality Date  . DILATION AND CURETTAGE OF UTERUS  7/14  . TUBAL LIGATION       OB History    Gravida  3   Para  1   Term  1   Preterm  0   AB  1   Living  1     SAB  0   IAB  1   Ectopic  0   Multiple  0   Live Births              No family history on file.  Social History   Tobacco Use  . Smoking status: Current Every Day Smoker    Types: Cigarettes  . Smokeless tobacco: Never Used  Substance Use Topics  . Alcohol use: No  . Drug use: No    Home Medications Prior to Admission medications   Medication Sig Start Date End Date Taking? Authorizing Provider  doxycycline (VIBRAMYCIN) 100 MG capsule Take 1 capsule (100 mg total) by mouth 2 (two) times daily. 02/28/21  Yes Juanna Pudlo, Dahlia Client, PA-C  diphenhydrAMINE (BENADRYL) 25 MG tablet Take 1 tablet (25 mg total) by mouth every 6 (six) hours as needed for itching. 10/06/16   Shaune Pollack, MD  famotidine (PEPCID) 20 MG tablet Take 1 tablet (20 mg total) by mouth 2 (two) times  daily. 10/06/16 10/11/16  Shaune Pollack, MD    Allergies    Penicillins and Lisinopril-hydrochlorothiazide  Review of Systems   Review of Systems  Constitutional: Negative for fever.  Gastrointestinal: Negative for nausea and vomiting.  Musculoskeletal: Positive for arthralgias.  Skin: Positive for color change.    Physical Exam Updated Vital Signs BP 111/81 (BP Location: Right Arm)   Pulse 89   Temp 99 F (37.2 C) (Oral)   Resp 16   Ht 5\' 4"  (1.626 m)   Wt 99.8 kg   LMP 02/21/2021   SpO2 100%   BMI 37.76 kg/m   Physical Exam Constitutional:      General: She is not in acute distress.    Appearance: Normal appearance. She is not ill-appearing.  HENT:     Head: Normocephalic and atraumatic.     Nose: No congestion.  Eyes:     Extraocular Movements: Extraocular movements intact.  Pulmonary:     Effort: Pulmonary effort is normal.  Musculoskeletal:        General: Normal range of motion.     Cervical back: Normal range of motion.  Skin:  Coloration: Skin is not pale.     Comments: Swelling, erythema and palpable fluctuance to the lateral fold around the nail of the right ring finger  Neurological:     General: No focal deficit present.     Mental Status: She is alert. Mental status is at baseline.  Psychiatric:        Mood and Affect: Mood normal.     ED Results / Procedures / Treatments   Labs (all labs ordered are listed, but only abnormal results are displayed) Labs Reviewed - No data to display  EKG None  Radiology No results found.  Procedures .Marland KitchenIncision and Drainage  Date/Time: 02/28/2021 8:18 PM Performed by: Dierdre Forth, PA-C Authorized by: Dierdre Forth, PA-C   Consent:    Consent obtained:  Verbal   Consent given by:  Patient   Risks discussed:  Bleeding, incomplete drainage, pain and damage to other organs   Alternatives discussed:  No treatment Universal protocol:    Procedure explained and questions answered to  patient or proxy's satisfaction: yes     Relevant documents present and verified: yes     Test results available : yes     Imaging studies available: yes     Required blood products, implants, devices, and special equipment available: yes     Site/side marked: yes     Immediately prior to procedure, a time out was called: yes     Patient identity confirmed:  Verbally with patient Location:    Type:  Abscess   Location:  Upper extremity   Upper extremity location:  Finger   Finger location:  R ring finger Pre-procedure details:    Skin preparation:  Betadine Sedation:    Sedation type:  None Anesthesia:    Anesthesia method:  Local infiltration   Local anesthetic:  Lidocaine 1% WITH epi Procedure type:    Complexity:  Complex Procedure details:    Incision types:  Single straight   Incision depth:  Subcutaneous   Wound management:  Probed and deloculated, irrigated with saline and extensive cleaning   Drainage:  Purulent   Drainage amount:  Moderate   Wound treatment:  Wound left open Post-procedure details:    Procedure completion:  Tolerated well, no immediate complications     Medications Ordered in ED Medications  lidocaine (PF) (XYLOCAINE) 1 % injection 5 mL (5 mLs Infiltration Given by Other 02/28/21 1932)    ED Course  I have reviewed the triage vital signs and the nursing notes.  Pertinent labs & imaging results that were available during my care of the patient were reviewed by me and considered in my medical decision making (see chart for details).    MDM Rules/Calculators/A&P                          Patient presents with paronychia of the right ring finger.  I&D here in the emergency department.  Home therapies discussed.  Antibiotics sent to the pharmacy.  Discussed reasons to return to the emergency department.  Patient states understanding and is in agreement with the plan.   Final Clinical Impression(s) / ED Diagnoses Final diagnoses:  Paronychia of  finger, right    Rx / DC Orders ED Discharge Orders         Ordered    doxycycline (VIBRAMYCIN) 100 MG capsule  2 times daily        02/28/21 2002  Odetta Forness, Boyd Kerbs 02/28/21 2019    Charlynne Pander, MD 02/28/21 [phone]

## 2021-08-20 ENCOUNTER — Encounter (HOSPITAL_BASED_OUTPATIENT_CLINIC_OR_DEPARTMENT_OTHER): Payer: Self-pay

## 2021-08-20 ENCOUNTER — Emergency Department (HOSPITAL_BASED_OUTPATIENT_CLINIC_OR_DEPARTMENT_OTHER)
Admission: EM | Admit: 2021-08-20 | Discharge: 2021-08-20 | Disposition: A | Discharge status: Home / Self Care | Payer: BC Managed Care – PPO | Attending: Emergency Medicine | Admitting: Emergency Medicine

## 2021-08-20 ENCOUNTER — Other Ambulatory Visit: Payer: Self-pay

## 2021-08-20 DIAGNOSIS — I10 Essential (primary) hypertension: Secondary | ICD-10-CM | POA: Insufficient documentation

## 2021-08-20 DIAGNOSIS — F17210 Nicotine dependence, cigarettes, uncomplicated: Secondary | ICD-10-CM | POA: Insufficient documentation

## 2021-08-20 DIAGNOSIS — J069 Acute upper respiratory infection, unspecified: Secondary | ICD-10-CM | POA: Insufficient documentation

## 2021-08-20 DIAGNOSIS — J029 Acute pharyngitis, unspecified: Secondary | ICD-10-CM | POA: Insufficient documentation

## 2021-08-20 DIAGNOSIS — Z20822 Contact with and (suspected) exposure to covid-19: Secondary | ICD-10-CM | POA: Insufficient documentation

## 2021-08-20 DIAGNOSIS — R059 Cough, unspecified: Secondary | ICD-10-CM | POA: Diagnosis present

## 2021-08-20 LAB — RESP PANEL BY RT-PCR (FLU A&B, COVID) ARPGX2
Influenza A by PCR: POSITIVE — AB
Influenza B by PCR: NEGATIVE
SARS Coronavirus 2 by RT PCR: NEGATIVE

## 2021-08-20 LAB — GROUP A STREP BY PCR: Group A Strep by PCR: NOT DETECTED

## 2021-08-20 MED ORDER — DEXAMETHASONE 4 MG PO TABS
10.0000 mg | ORAL_TABLET | Freq: Once | ORAL | Status: AC
  Administered 2021-08-20: 10 mg via ORAL
  Filled 2021-08-20: qty 3

## 2021-08-20 NOTE — ED Provider Notes (Signed)
[facility] EMERGENCY DEPARTMENT Provider Note   CSN: [id number] Arrival date & time: 08/20/21  1012     History Chief Complaint  Patient presents with   Sore Throat   Fever    Tina Rogers is a 35 y.o. female.  The history is provided by the patient.  Sore Throat This is a new problem. The current episode started yesterday. The problem occurs constantly. Pertinent negatives include no chest pain, no abdominal pain, no headaches and no shortness of breath. Nothing aggravates the symptoms. Nothing relieves the symptoms. She has tried nothing for the symptoms. The treatment provided no relief.  Fever Associated symptoms: congestion and sore throat   Associated symptoms: no chest pain, no chills, no cough, no diarrhea, no dysuria, no ear pain, no headaches, no myalgias, no nausea, no rash, no rhinorrhea, no somnolence and no vomiting   Risk factors: sick contacts       Past Medical History:  Diagnosis Date   Hypertension    Miscarriage 7/15    Patient Active Problem List   Diagnosis Date Noted   Abnormal maternal serum screening test 11/22/2015   [redacted] weeks gestation of pregnancy     Past Surgical History:  Procedure Laterality Date   DILATION AND CURETTAGE OF UTERUS  7/14   TUBAL LIGATION       OB History     Gravida  3   Para  1   Term  1   Preterm  0   AB  1   Living  1      SAB  0   IAB  1   Ectopic  0   Multiple  0   Live Births              No family history on file.  Social History   Tobacco Use   Smoking status: Every Day    Types: Cigarettes   Smokeless tobacco: Never  Substance Use Topics   Alcohol use: No   Drug use: No    Home Medications Prior to Admission medications   Medication Sig Start Date End Date Taking? Authorizing Provider  diphenhydrAMINE (BENADRYL) 25 MG tablet Take 1 tablet (25 mg total) by mouth every 6 (six) hours as needed for itching. 10/06/16   Shaune Pollack, MD  doxycycline  (VIBRAMYCIN) 100 MG capsule Take 1 capsule (100 mg total) by mouth 2 (two) times daily. 02/28/21   Muthersbaugh, Dahlia Client, PA-C  famotidine (PEPCID) 20 MG tablet Take 1 tablet (20 mg total) by mouth 2 (two) times daily. 10/06/16 10/11/16  Shaune Pollack, MD    Allergies    Penicillins and Lisinopril-hydrochlorothiazide  Review of Systems   Review of Systems  Constitutional:  Positive for fever. Negative for chills.  HENT:  Positive for congestion and sore throat. Negative for ear pain and rhinorrhea.   Respiratory:  Negative for cough and shortness of breath.   Cardiovascular:  Negative for chest pain.  Gastrointestinal:  Negative for abdominal pain, diarrhea, nausea and vomiting.  Genitourinary:  Negative for dysuria.  Musculoskeletal:  Negative for myalgias.  Skin:  Negative for rash.  Neurological:  Negative for headaches.   Physical Exam Updated Vital Signs BP 131/88 (BP Location: Right Arm)   Pulse 81   Temp 98.3 F (36.8 C) (Oral)   Resp 20   Ht 5\' 4"  (1.626 m)   Wt 97.5 kg   LMP 08/12/2021 (Approximate)   SpO2 98%   BMI 36.90 kg/m   Physical Exam  Vitals and nursing note reviewed.  Constitutional:      General: She is not in acute distress.    Appearance: She is well-developed.  HENT:     Head: Normocephalic and atraumatic.     Nose: No congestion.     Mouth/Throat:     Mouth: No oral lesions.     Pharynx: No pharyngeal swelling, oropharyngeal exudate or posterior oropharyngeal erythema.     Tonsils: No tonsillar exudate.  Eyes:     Conjunctiva/sclera: Conjunctivae normal.  Cardiovascular:     Rate and Rhythm: Normal rate and regular rhythm.     Heart sounds: No murmur heard. Pulmonary:     Effort: Pulmonary effort is normal. No respiratory distress.     Breath sounds: Normal breath sounds.  Abdominal:     Palpations: Abdomen is soft.     Tenderness: There is no abdominal tenderness.  Musculoskeletal:     Cervical back: Neck supple.  Skin:    General: Skin  is warm and dry.  Neurological:     Mental Status: She is alert.    ED Results / Procedures / Treatments   Labs (all labs ordered are listed, but only abnormal results are displayed) Labs Reviewed  GROUP A STREP BY PCR  RESP PANEL BY RT-PCR (FLU A&B, COVID) [id number]    EKG None  Radiology No results found.  Procedures Procedures   Medications Ordered in ED Medications  dexamethasone (DECADRON) tablet 10 mg (10 mg Oral Given 08/20/21 1051)    ED Course  I have reviewed the triage vital signs and the nursing notes.  Pertinent labs & imaging results that were available during my care of the patient were reviewed by me and considered in my medical decision making (see chart for details).    MDM Rules/Calculators/A&P                           Tina Rogers is here with flulike symptoms.  No signs of throat infection on exam.  Will test for strep and COVID and flu.  Normal vitals.  No fever.  Very well-appearing.  Patient with sick contact with similar symptoms.  Overall suspect flu.  Given a dose of Decadron to help with strep throat.  No concern for pregnancy.  We will call in antibiotic if needed for strep.  Discharged in good condition.  Understands return precautions.  This chart was dictated using voice recognition software.  Despite best efforts to proofread,  errors can occur which can change the documentation meaning.   Final Clinical Impression(s) / ED Diagnoses Final diagnoses:  Viral upper respiratory tract infection  Sore throat    Rx / DC Orders ED Discharge Orders     None        Virgina Norfolk, DO 08/20/21 1053

## 2021-08-20 NOTE — ED Triage Notes (Signed)
Pt reports sore throat, cough, fever and body aches. Pt reports son had the flu

## 2021-08-20 NOTE — Discharge Instructions (Signed)
Follow-up strep and viral testing online.  Continue Tylenol and ibuprofen as needed.  I will call you in an antibiotic if strep test is positive.

## 2024-04-30 ENCOUNTER — Emergency Department (HOSPITAL_BASED_OUTPATIENT_CLINIC_OR_DEPARTMENT_OTHER)

## 2024-04-30 ENCOUNTER — Encounter (HOSPITAL_BASED_OUTPATIENT_CLINIC_OR_DEPARTMENT_OTHER): Payer: Self-pay | Admitting: Emergency Medicine

## 2024-04-30 ENCOUNTER — Other Ambulatory Visit: Payer: Self-pay

## 2024-04-30 ENCOUNTER — Emergency Department (HOSPITAL_BASED_OUTPATIENT_CLINIC_OR_DEPARTMENT_OTHER)
Admission: EM | Admit: 2024-04-30 | Discharge: 2024-05-01 | Disposition: A | Discharge status: Home / Self Care | Attending: Emergency Medicine | Admitting: Emergency Medicine

## 2024-04-30 DIAGNOSIS — S93601A Unspecified sprain of right foot, initial encounter: Secondary | ICD-10-CM | POA: Diagnosis not present

## 2024-04-30 DIAGNOSIS — Y990 Civilian activity done for income or pay: Secondary | ICD-10-CM | POA: Diagnosis not present

## 2024-04-30 DIAGNOSIS — Y93I9 Activity, other involving external motion: Secondary | ICD-10-CM | POA: Diagnosis not present

## 2024-04-30 DIAGNOSIS — M79671 Pain in right foot: Secondary | ICD-10-CM | POA: Diagnosis present

## 2024-04-30 MED ORDER — OXYCODONE-ACETAMINOPHEN 5-325 MG PO TABS
2.0000 | ORAL_TABLET | Freq: Once | ORAL | Status: AC
  Administered 2024-04-30: 2 via ORAL
  Filled 2024-04-30: qty 2

## 2024-04-30 MED ORDER — KETOROLAC TROMETHAMINE 60 MG/2ML IM SOLN
30.0000 mg | Freq: Once | INTRAMUSCULAR | Status: AC
  Administered 2024-04-30: 30 mg via INTRAMUSCULAR
  Filled 2024-04-30: qty 2

## 2024-04-30 MED ORDER — MELOXICAM 15 MG PO TABS: 15.0000 mg | ORAL_TABLET | Freq: Every day | ORAL | 0 refills | Status: AC

## 2024-04-30 NOTE — ED Provider Notes (Signed)
 [place] [facility]ERGENCY DEPARTMENT AT [facility] Provider Note   CSN: [id number] Arrival date & time: 04/30/24  2159     Patient presents with: Foot Pain   Tina Rogers is a 38 y.o. female.   38 year old female presents ER today with right foot pain.  Patient states that it started hurting around 4:00.  She was wearing tennis shoes at the time.  She does not know what she was doing but is sure that she did not have an injury.  No twisting, trauma or any other event that she can recall. No recent illnesses. No h/o foot problems. 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    Foot Pain       Prior to Admission medications   Medication Sig Start Date End Date Taking? Authorizing Provider  meloxicam  (MOBIC ) 15 MG tablet Take 1 tablet (15 mg total) by mouth daily for 15 days. 04/30/24 05/15/24 Yes Harjit Douds, Selinda, MD  diphenhydrAMINE  (BENADRYL ) 25 MG tablet Take 1 tablet (25 mg total) by mouth every 6 (six) hours as needed for itching. 10/06/16   Angelena Smalls, MD  doxycycline  (VIBRAMYCIN ) 100 MG capsule Take 1 capsule (100 mg total) by mouth 2 (two) times daily. 02/28/21   Muthersbaugh, Chiquita, PA-C  famotidine  (PEPCID ) 20 MG tablet Take 1 tablet (20 mg total) by mouth 2 (two) times daily. 10/06/16 10/11/16  Angelena Smalls, MD    Allergies: Penicillins and Lisinopril-hydrochlorothiazide    Review of Systems  Updated Vital Signs BP 122/86   Pulse 86   Temp 98.8 F (37.1 C)   Resp 16   Ht 5' 5 (1.651 m)   Wt 81.6 kg   SpO2 100%   BMI 29.95 kg/m   Physical Exam Vitals and nursing note reviewed.  Constitutional:      Appearance: She is well-developed.  HENT:     Head: Normocephalic and atraumatic.  Cardiovascular:     Rate and Rhythm: Normal rate and regular rhythm.  Pulmonary:     Effort: No respiratory distress.     Breath sounds: No stridor.  Abdominal:     General: There is no distension.  Musculoskeletal:     Cervical  back: Normal range of motion.     Comments: Mild swelling and tenderness over dorsum of right foot with some tenderness with lateral compression. No ecchymosis or obvious bony deformity.   Skin:    General: Skin is warm and dry.  Neurological:     Mental Status: She is alert.     (all labs ordered are listed, but only abnormal results are displayed) Labs Reviewed - No data to display  EKG: None  Radiology: DG Foot Complete Right Result Date: 04/30/2024 CLINICAL DATA:  Right foot pain EXAM: RIGHT FOOT COMPLETE - 3+ VIEW COMPARISON:  None Available. FINDINGS: There is no evidence of fracture or dislocation. There is no evidence of arthropathy or other focal bone abnormality. Soft tissues are unremarkable. IMPRESSION: Negative. Electronically Signed   By: Dorethia Molt M.D.   On: 04/30/2024 22:43     Procedures   Medications Ordered in the ED  ketorolac  (TORADOL ) injection 30 mg (has no administration in time range)  oxyCODONE -acetaminophen  (PERCOCET/ROXICET) 5-325 MG per tablet 2 tablet (has no administration in time range)                                    Medical  Decision Making Amount and/or Complexity of Data Reviewed Radiology: ordered.  Risk Prescription drug management.   Patient not able to take off of work, will try to work with her boss on the best way to keep off her feet as much as possible. otherwise NSAIDs, compression, rest and pcp fu in 7-10 days if not improving. Xr withotu fracture or malalignment.      Final diagnoses:  Sprain of right foot, initial encounter    ED Discharge Orders          Ordered    meloxicam  (MOBIC ) 15 MG tablet  Daily        04/30/24 2341               Karime Scheuermann, Selinda, MD 04/30/24 2345

## 2024-04-30 NOTE — ED Triage Notes (Signed)
 Pt c/o pain to top of RT foot since 4pm; no injury, amb to triage
